# Patient Record
Sex: Male | Born: 1985 | Race: White | Hispanic: No | Marital: Single | Smoking: Current every day smoker
Health system: Southern US, Community
[De-identification: ages and names within clinical notes are randomized; demographics above are authoritative.]

## PROBLEM LIST (undated history)

## (undated) DIAGNOSIS — F909 Attention-deficit hyperactivity disorder, unspecified type: Secondary | ICD-10-CM

## (undated) HISTORY — DX: Attention-deficit hyperactivity disorder, unspecified type: F90.9

---
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
## 2006-03-05 ENCOUNTER — Emergency Department (HOSPITAL_COMMUNITY): Admission: EM | Admit: 2006-03-05 | Discharge: 2006-03-05 | Payer: Self-pay | Admitting: Family Medicine

## 2008-07-12 ENCOUNTER — Emergency Department (HOSPITAL_COMMUNITY): Admission: EM | Admit: 2008-07-12 | Discharge: 2008-07-12 | Payer: Self-pay | Admitting: Emergency Medicine

## 2009-05-21 IMAGING — CR DG ABDOMEN ACUTE W/ 1V CHEST
3 series · 3 of 3 positions shown · non-contrast
Comparison: None

CLINICAL DATA: Shortness of breath, vomiting, diarrhea

ACUTE ABDOMEN SERIES (ABDOMEN 2 VIEW & CHEST 1 VIEW)

[w chest pa]
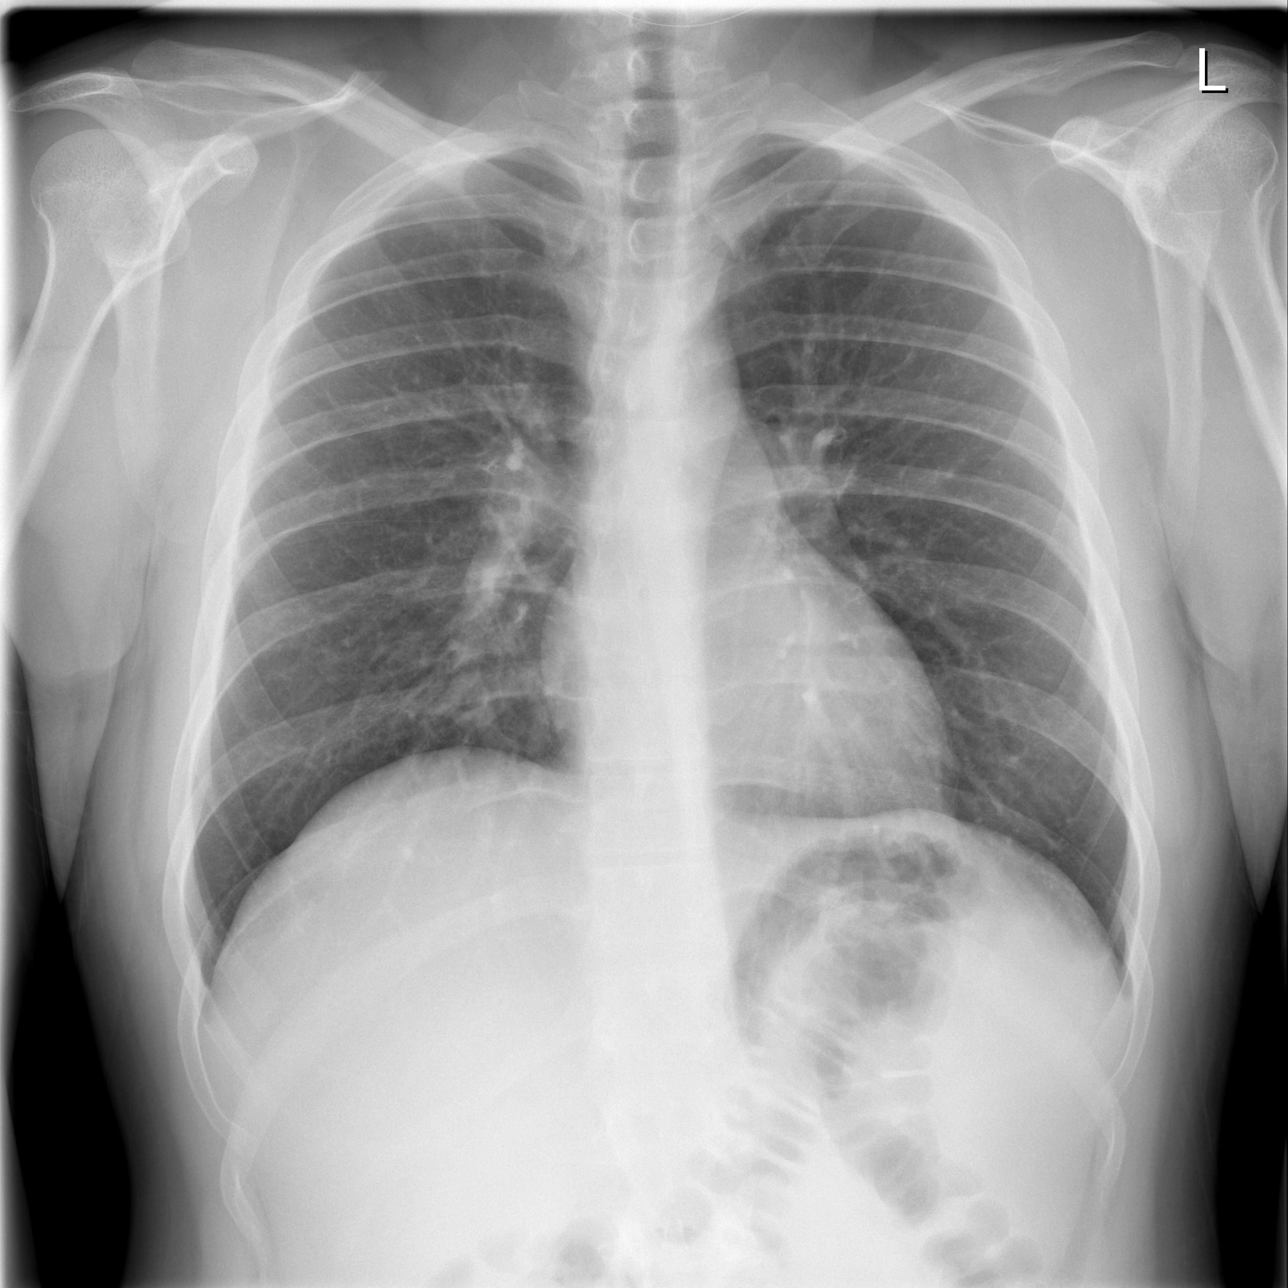

[w abdomen upright]
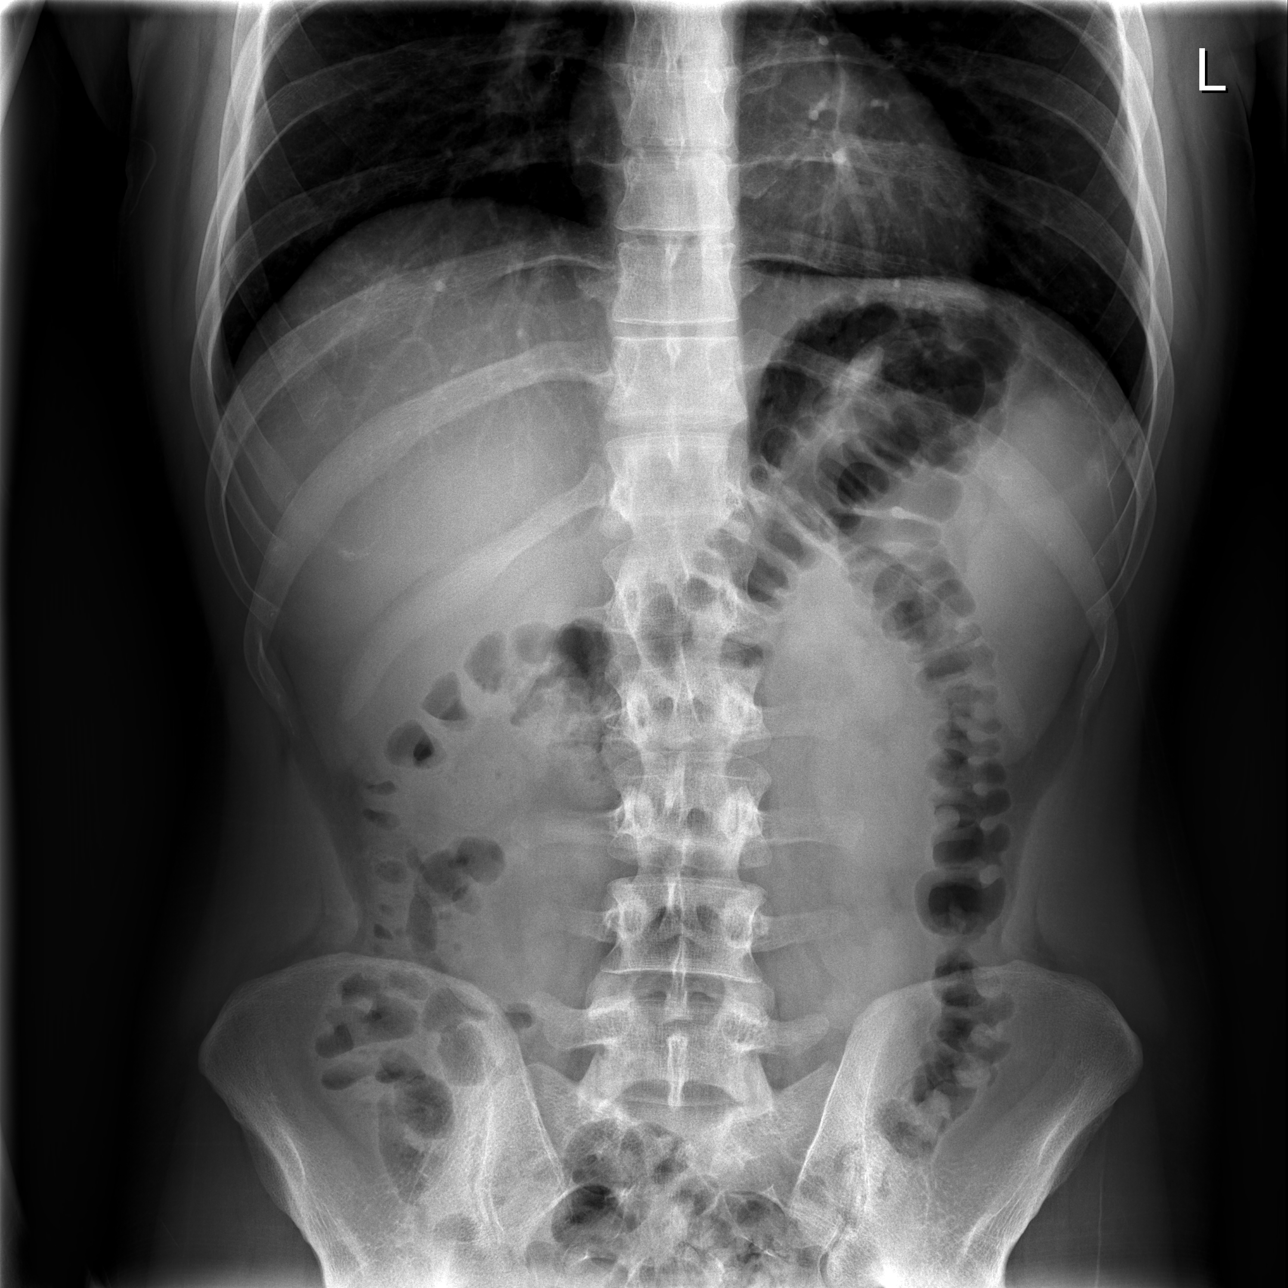

[t abdomen supine]
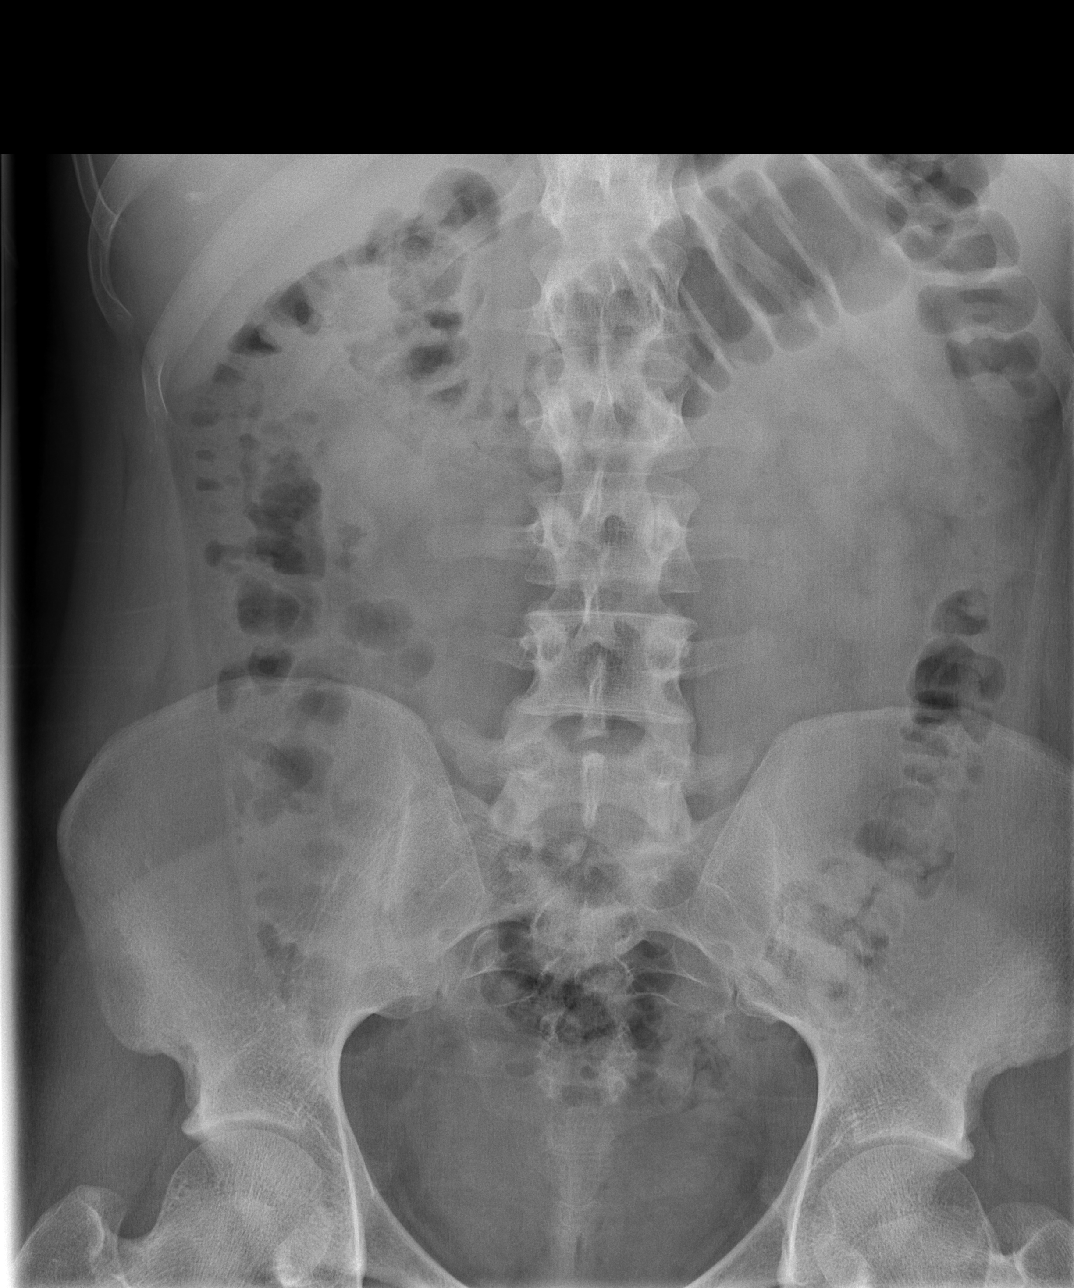

[3 of 3 positions shown; findings below may reference images not displayed]

FINDINGS: There is no evidence of dilated bowel loops or free
intraperitoneal air.  No radiopaque calculi or other significant
radiographic abnormality is seen. Heart size and mediastinal
contours are within normal limits.  Both lungs are clear.
IMPRESSION: Negative abdominal radiographs.  No acute cardiopulmonary disease.

## 2010-10-22 LAB — DIFFERENTIAL
Basophils Absolute: 0 10*3/uL (ref 0.0–0.1)
Basophils Relative: 1 % (ref 0–1)
Eosinophils Absolute: 0.2 10*3/uL (ref 0.0–0.7)
Eosinophils Relative: 2 % (ref 0–5)
Lymphocytes Relative: 18 % (ref 12–46)
Lymphs Abs: 1.5 10*3/uL (ref 0.7–4.0)
Monocytes Absolute: 0.5 10*3/uL (ref 0.1–1.0)
Monocytes Relative: 6 % (ref 3–12)
Neutro Abs: 6.5 10*3/uL (ref 1.7–7.7)
Neutrophils Relative %: 74 % (ref 43–77)

## 2010-10-22 LAB — COMPREHENSIVE METABOLIC PANEL
ALT: 25 U/L (ref 0–53)
AST: 21 U/L (ref 0–37)
Albumin: 4.3 g/dL (ref 3.5–5.2)
Alkaline Phosphatase: 82 U/L (ref 39–117)
BUN: 5 mg/dL — ABNORMAL LOW (ref 6–23)
CO2: 25 mEq/L (ref 19–32)
Calcium: 9.4 mg/dL (ref 8.4–10.5)
Chloride: 105 mEq/L (ref 96–112)
Creatinine, Ser: 0.83 mg/dL (ref 0.4–1.5)
GFR calc Af Amer: >60 mL/min (ref >60)
GFR calc non Af Amer: >60 mL/min (ref >60)
Glucose, Bld: 95 mg/dL (ref 70–99)
Potassium: 4.2 mEq/L (ref 3.5–5.1)
Sodium: 139 mEq/L (ref 135–145)
Total Bilirubin: 1.5 mg/dL — ABNORMAL HIGH (ref 0.3–1.2)
Total Protein: 6.8 g/dL (ref 6.0–8.3)

## 2010-10-22 LAB — CBC
HCT: 49.3 % (ref 39.0–52.0)
Hemoglobin: 16.9 g/dL (ref 13.0–17.0)
MCHC: 34.3 g/dL (ref 30.0–36.0)
MCV: 100 fL (ref 78.0–100.0)
Platelets: 156 10*3/uL (ref 150–400)
RBC: 4.93 MIL/uL (ref 4.22–5.81)
RDW: 11.9 % (ref 11.5–15.5)
WBC: 8.8 10*3/uL (ref 4.0–10.5)

## 2011-07-05 ENCOUNTER — Encounter: Payer: Self-pay | Admitting: *Deleted

## 2011-07-05 ENCOUNTER — Emergency Department (HOSPITAL_COMMUNITY)
Admission: EM | Admit: 2011-07-05 | Discharge: 2011-07-05 | Disposition: A | Discharge status: Home / Self Care | Payer: Self-pay | Attending: Emergency Medicine | Admitting: Emergency Medicine

## 2011-07-05 DIAGNOSIS — B86 Scabies: Secondary | ICD-10-CM | POA: Insufficient documentation

## 2011-07-05 MED ORDER — PERMETHRIN 5 % EX CREA: TOPICAL_CREAM | CUTANEOUS | Status: AC

## 2011-07-05 NOTE — ED Notes (Signed)
Pt in c/o generalized rash, states he has had it x2 months, friend in house with same rash

## 2011-07-05 NOTE — ED Provider Notes (Signed)
Medical screening examination/treatment/procedure(s) were performed by non-physician practitioner and as supervising physician I was immediately available for consultation/collaboration.  Viviene Thurston T Heily Carlucci, MD 07/05/11 2342 

## 2011-07-05 NOTE — ED Provider Notes (Signed)
History     CSN: [id number]  Arrival date & time 07/05/11  1716   First MD Initiated Contact with Patient 07/05/11 1756      Chief Complaint  Patient presents with  . Rash    (Consider location/radiation/quality/duration/timing/severity/associated sxs/prior treatment) HPI Comments: Show a progressive rash over the entire body, between the finger webs in the groin, behind the knees.  The antecubital fossa is in the axilla  Patient is a 25 y.o. male presenting with rash. The history is provided by the patient.  Rash  The current episode started more than 1 week ago. The problem has been gradually worsening. The problem is associated with an unknown factor. There has been no fever. The pain is at a severity of 1/10. The patient is experiencing no pain. Associated symptoms include itching. He has tried nothing for the symptoms.    History reviewed. No pertinent past medical history.  History reviewed. No pertinent past surgical history.  History reviewed. No pertinent family history.  History  Substance Use Topics  . Smoking status: Not on file  . Smokeless tobacco: Not on file  . Alcohol Use: Not on file      Review of Systems  Constitutional: Negative.   HENT: Negative.   Eyes: Negative.   Cardiovascular: Negative.   Genitourinary: Negative.   Musculoskeletal: Negative for joint swelling.  Skin: Positive for itching and rash.  Neurological: Negative.   Hematological: Negative.   Psychiatric/Behavioral: Negative.     Allergies  Review of patient's allergies indicates no known allergies.  Home Medications   Current Outpatient Rx  Name Route Sig Dispense Refill  . AMPHETAMINE-DEXTROAMPHETAMINE 20 MG PO TABS Oral Take 20 mg by mouth daily.      . ASPIRIN 325 MG PO TABS Oral Take 325 mg by mouth every 4 (four) hours as needed. For pain relief     . HYDROCORTISONE 0.5 % EX CREA Topical Apply 1 application topically 2 (two) times daily.      Marland Kitchen PERMETHRIN 5 % EX  CREA  Apply to affected area once leave on skin for 12 hours than bath repeat in 14 days 60 g 1    BP 138/84  Pulse 87  Temp(Src) 97.7 F (36.5 C) (Oral)  Resp 20  SpO2 100%  Physical Exam  Constitutional: He appears well-developed and well-nourished.  HENT:  Head: Normocephalic.  Eyes: Pupils are equal, round, and reactive to light.  Cardiovascular: Normal rate.   Pulmonary/Chest: Effort normal.  Abdominal: Soft.  Skin: Skin is warm and dry. Rash noted.  Psychiatric: He has a normal mood and affect.    ED Course  Procedures (including critical care time)  Labs Reviewed - No data to display No results found.   1. Scabies       MDM  Scabies        Arman Filter, NP 07/05/11 1830  Arman Filter, NP 07/05/11 [phone]

## 2016-08-13 MED FILL — SUBOXONE 8 MG-2 MG SL FILM: 8-2 | 7 days supply | Qty: 14 | Fill #0

## 2018-05-06 ENCOUNTER — Ambulatory Visit: Payer: Self-pay | Admitting: Nurse Practitioner

## 2018-05-06 ENCOUNTER — Encounter: Payer: Self-pay | Admitting: Nurse Practitioner

## 2018-05-06 VITALS — BP 138/88 | HR 90 | Temp 98.0°F | Ht 67.0 in | Wt 180.2 lb

## 2018-05-06 DIAGNOSIS — F900 Attention-deficit hyperactivity disorder, predominantly inattentive type: Secondary | ICD-10-CM

## 2018-05-06 MED ORDER — LISDEXAMFETAMINE DIMESYLATE 70 MG PO CAPS: 70.0000 mg | ORAL_CAPSULE | Freq: Every day | ORAL | 0 refills | Status: DC

## 2018-05-06 NOTE — Progress Notes (Signed)
  Subjective:     Patient ID: James Hardin , male    DOB: 1986-03-07 , 32 y.o.   MRN: [id number]   Chief Complaint  Patient presents with  . Med Check    HPI   Attention Deficit with hyperactivity - he has been taking vyvanse 70 mg daily.  He is currently between here and [place], currently not working.  He is planning to call his old job.      Past Medical History:  Diagnosis Date  . ADHD (attention deficit hyperactivity disorder)      No family history on file.   Current Outpatient Medications:  .  lisdexamfetamine (VYVANSE) 70 MG capsule, Take 1 capsule (70 mg total) by mouth daily., Disp: 30 capsule, Rfl: 0   No Known Allergies   Review of Systems  Constitutional: Negative.   Respiratory: Negative.   Cardiovascular: Negative.   Skin: Negative.   Neurological: Negative.   Psychiatric/Behavioral: The patient is hyperactive.      Today's Vitals   05/06/18 1544  BP: 138/88  Pulse: 90  Temp: 98 F (36.7 C)  TempSrc: Oral  SpO2: 97%  Weight: 180 lb 3.2 oz (81.7 kg)  Height: 5\' 7"  (1.702 m)  PainSc: 0-No pain   Body mass index is 28.22 kg/m.   Objective:  Physical Exam  Constitutional: He is oriented to person, place, and time. He appears well-developed and well-nourished.  Cardiovascular: Normal rate.  Pulmonary/Chest: Effort normal and breath sounds normal.  Musculoskeletal: Normal range of motion.  Neurological: He is alert and oriented to person, place, and time.  Skin: Skin is warm and dry.        Assessment And Plan:     1. Attention deficit hyperactivity disorder (ADHD), predominantly inattentive type  Chronic, controlled  Continue with current medications  Form completed for [facility]       Arnette Felts, FNP

## 2018-05-06 NOTE — Addendum Note (Signed)
Addended by: Arnette Felts F on: 05/06/2018 04:15 PM   Modules accepted: Orders

## 2018-05-13 ENCOUNTER — Telehealth: Payer: Self-pay

## 2018-05-13 NOTE — Telephone Encounter (Signed)
Called [employer] about them sending forms stating the patient was denied and the second letter stating they had some missing documents of the patient's proof of income. They stated the patient was denied because he could be eligible for medicaid. She also stated if patient is does not qualify for medicaid we would need to send in a letter of necessity along with the denial letter. Patient is aware. YRL,RMA

## 2019-09-27 ENCOUNTER — Other Ambulatory Visit: Payer: Self-pay

## 2019-09-27 ENCOUNTER — Telehealth: Payer: Self-pay | Admitting: Nurse Practitioner

## 2019-09-27 ENCOUNTER — Encounter: Payer: Self-pay | Admitting: Nurse Practitioner

## 2019-09-27 ENCOUNTER — Ambulatory Visit (INDEPENDENT_AMBULATORY_CARE_PROVIDER_SITE_OTHER): Payer: Self-pay | Admitting: Nurse Practitioner

## 2019-09-27 VITALS — BP 118/76 | HR 84 | Temp 97.7°F | Ht 67.0 in | Wt 170.2 lb

## 2019-09-27 DIAGNOSIS — F900 Attention-deficit hyperactivity disorder, predominantly inattentive type: Secondary | ICD-10-CM

## 2019-09-27 DIAGNOSIS — R369 Urethral discharge, unspecified: Secondary | ICD-10-CM

## 2019-09-27 DIAGNOSIS — A64 Unspecified sexually transmitted disease: Secondary | ICD-10-CM

## 2019-09-27 MED ORDER — LISDEXAMFETAMINE DIMESYLATE 70 MG PO CAPS: 70.0000 mg | ORAL_CAPSULE | Freq: Every day | ORAL | 0 refills | Status: DC

## 2019-09-27 NOTE — Progress Notes (Signed)
Based on what you shared with me it looks like you have STD,that should be evaluated in a face to face office visit. Gonorrhea needs to be treated with an IM injection. You also need to be tested for other STD. Please see someone as soon as possible.    NOTE: If you entered your credit card information for this eVisit, you will not be charged. You may see a "hold" on your card for the $35 but that hold will drop off and you will not have a charge processed.  If you are having a true medical emergency please call 911.     For an urgent face to face visit, [facility] has four urgent care centers for your convenience:   . [facility]    [phone]                  Get Driving Directions  [address] [place], [place] [address] . 10 am to 8 pm Monday-Friday . 12 pm to 8 pm Saturday-Sunday   . [facility]  [phone]                  Get Driving Directions  [address], [address] [place], [place] [address] . 8 am to 8 pm Monday-Friday . 9 am to 6 pm Saturday . 11 am to 6 pm Sunday   . [facility] at [facility]  [phone]                  Get Driving Directions   [address] [place], [place] [address] . 8 am to 8 pm Monday-Friday . 8 am to 4 pm Saturday-Sunday    . [facility] at [facility] Directions  [phone]  [address] [place], [place] [address]  . Monday-Friday, 12 PM to 6 PM    Your e-visit answers were reviewed by a board certified advanced clinical practitioner to complete your personal care plan.  Thank you for using e-Visits.

## 2019-09-27 NOTE — Progress Notes (Signed)
This visit occurred during the SARS-CoV-2 public health emergency.  Safety protocols were in place, including screening questions prior to the visit, additional usage of staff PPE, and extensive cleaning of exam room while observing appropriate contact time as indicated for disinfecting solutions.  Subjective:     Patient ID: James Hardin , male    DOB: 22-Oct-1985 , 34 y.o.   MRN: [id number]   Chief Complaint  Patient presents with  . 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                   Here today for ADHD medications has been out for quite some time and admits to taking his brothers adderall at times.                  Past Medical History:  Diagnosis Date  . ADHD (attention deficit hyperactivity disorder)      No family history on file.   Current Outpatient Medications:  .  lisdexamfetamine (VYVANSE) 70 MG capsule, Take 1 capsule (70 mg total) by mouth daily., Disp: 30 capsule, Rfl: 0   No Known Allergies   Review of Systems  Constitutional: Negative.   Respiratory: Negative.   Cardiovascular: Negative.  Negative for chest pain, palpitations and leg swelling.  Neurological: Negative for dizziness and headaches.  Psychiatric/Behavioral: Negative.      Today's Vitals   09/27/19 1638  BP: 118/76  Pulse: 84  Temp: 97.7 F (36.5 C)  TempSrc: Oral  SpO2: 97%  Weight: 170 lb 3.2 oz (77.2 kg)  Height: 5\' 7"  (1.702 m)   Body mass index is 26.66 kg/m.   Objective:  Physical Exam Constitutional:      Appearance: Normal appearance.  Cardiovascular:     Rate and Rhythm: Normal rate and regular rhythm.     Pulses: Normal pulses.     Heart sounds: Normal heart sounds. No murmur.  Pulmonary:     Effort: Pulmonary effort is  normal.     Breath sounds: Normal breath sounds.  Skin:    General: Skin is warm.     Capillary Refill: Capillary refill takes less than 2 seconds.  Neurological:     General: No focal deficit present.     Mental Status: He is alert and oriented to person, place, and time.     Cranial Nerves: No cranial nerve deficit.  Psychiatric:        Mood and Affect: Mood normal.        Behavior: Behavior normal.        Thought Content: Thought content normal.        Judgment: Judgment normal.         Assessment And Plan:     1. Attention deficit hyperactivity disorder (ADHD), predominantly inattentive type  Will restart his vyvanse  I have explained to him to not take others medications  He will drop the form off for [employer] financial assistance - lisdexamfetamine (VYVANSE) 70 MG capsule; Take 1 capsule (70 mg total) by mouth daily.  Dispense: 30 capsule; Refill: 0  2. Penile discharge  He is concerned may have an STD but has an appt at  the health department tomorrow morning.    Arnette Felts, FNP    THE PATIENT IS ENCOURAGED TO PRACTICE SOCIAL DISTANCING DUE TO THE COVID-19 PANDEMIC.

## 2019-10-04 ENCOUNTER — Encounter: Payer: Self-pay | Admitting: Nurse Practitioner

## 2019-10-21 ENCOUNTER — Other Ambulatory Visit: Payer: Self-pay | Admitting: Nurse Practitioner

## 2019-10-21 DIAGNOSIS — F900 Attention-deficit hyperactivity disorder, predominantly inattentive type: Secondary | ICD-10-CM

## 2019-10-21 MED ORDER — LISDEXAMFETAMINE DIMESYLATE 70 MG PO CAPS: 70.0000 mg | ORAL_CAPSULE | Freq: Every day | ORAL | 0 refills | Status: AC

## 2019-12-09 ENCOUNTER — Encounter (HOSPITAL_COMMUNITY): Payer: Self-pay

## 2019-12-09 ENCOUNTER — Other Ambulatory Visit: Payer: Self-pay

## 2019-12-09 ENCOUNTER — Emergency Department (HOSPITAL_COMMUNITY)
Admission: EM | Admit: 2019-12-09 | Discharge: 2019-12-10 | Discharge status: Against Medical Advice | Payer: Self-pay | Attending: Emergency Medicine | Admitting: Emergency Medicine

## 2019-12-09 DIAGNOSIS — Z5321 Procedure and treatment not carried out due to patient leaving prior to being seen by health care provider: Secondary | ICD-10-CM | POA: Insufficient documentation

## 2019-12-09 DIAGNOSIS — R05 Cough: Secondary | ICD-10-CM | POA: Insufficient documentation

## 2019-12-09 NOTE — ED Notes (Signed)
Called for pt 3x times, no answer
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
## 2019-12-28 ENCOUNTER — Ambulatory Visit: Payer: Self-pay | Admitting: Nurse Practitioner

## 2021-11-21 ENCOUNTER — Emergency Department (EMERGENCY_DEPARTMENT_HOSPITAL): Payer: MEDICAID

## 2021-11-21 ENCOUNTER — Emergency Department (HOSPITAL_BASED_OUTPATIENT_CLINIC_OR_DEPARTMENT_OTHER): Payer: Medicaid Other

## 2021-11-21 ENCOUNTER — Emergency Department
Admission: EM | Admit: 2021-11-21 | Discharge: 2021-11-21 | Disposition: A | Discharge status: Home / Self Care | Payer: MEDICAID | Attending: FAMILY PRACTICE | Admitting: FAMILY PRACTICE

## 2021-11-21 ENCOUNTER — Encounter (HOSPITAL_BASED_OUTPATIENT_CLINIC_OR_DEPARTMENT_OTHER): Payer: Self-pay | Admitting: FAMILY PRACTICE

## 2021-11-21 ENCOUNTER — Other Ambulatory Visit: Payer: Self-pay

## 2021-11-21 DIAGNOSIS — Z7252 High risk homosexual behavior: Secondary | ICD-10-CM

## 2021-11-21 DIAGNOSIS — Z20822 Contact with and (suspected) exposure to covid-19: Secondary | ICD-10-CM | POA: Insufficient documentation

## 2021-11-21 DIAGNOSIS — R6883 Chills (without fever): Secondary | ICD-10-CM

## 2021-11-21 DIAGNOSIS — R531 Weakness: Secondary | ICD-10-CM

## 2021-11-21 DIAGNOSIS — Z23 Encounter for immunization: Secondary | ICD-10-CM | POA: Insufficient documentation

## 2021-11-21 DIAGNOSIS — L02413 Cutaneous abscess of right upper limb: Secondary | ICD-10-CM | POA: Insufficient documentation

## 2021-11-21 DIAGNOSIS — R5383 Other fatigue: Secondary | ICD-10-CM

## 2021-11-21 DIAGNOSIS — Z7251 High risk heterosexual behavior: Secondary | ICD-10-CM

## 2021-11-21 DIAGNOSIS — R61 Generalized hyperhidrosis: Secondary | ICD-10-CM

## 2021-11-21 DIAGNOSIS — R6889 Other general symptoms and signs: Secondary | ICD-10-CM | POA: Insufficient documentation

## 2021-11-21 DIAGNOSIS — L02419 Cutaneous abscess of limb, unspecified: Secondary | ICD-10-CM

## 2021-11-21 LAB — COMPREHENSIVE METABOLIC PANEL, NON-FASTING
ALBUMIN/GLOBULIN RATIO: 1.2 (ref 0.8–1.4)
ALBUMIN: 4 g/dL (ref 3.4–5.0)
ALKALINE PHOSPHATASE: 94 U/L (ref 46–116)
ALT (SGPT): 13 U/L (ref <=78)
ANION GAP: 11 mmol/L (ref 10–20)
AST (SGOT): 18 U/L (ref 15–37)
BILIRUBIN TOTAL: 0.6 mg/dL (ref 0.2–1.0)
BUN/CREA RATIO: 15
BUN: 12 mg/dL (ref 7–18)
CALCIUM, CORRECTED: 9.9 mg/dL
CALCIUM: 9.9 mg/dL (ref 8.5–10.1)
CHLORIDE: 105 mmol/L (ref 98–107)
CO2 TOTAL: 24 mmol/L (ref 21–32)
CREATININE: 0.81 mg/dL (ref 0.70–1.30)
ESTIMATED GFR: 119 mL/min/{1.73_m2} (ref >59)
GLOBULIN: 3.4
GLUCOSE: 97 mg/dL (ref 74–106)
OSMOLALITY, CALCULATED: 279 mOsm/kg (ref 270–290)
POTASSIUM: 3.8 mmol/L (ref 3.5–5.1)
PROTEIN TOTAL: 7.4 g/dL (ref 6.4–8.2)
SODIUM: 140 mmol/L (ref 136–145)

## 2021-11-21 LAB — CBC WITH DIFF
BASOPHIL #: 0.01 10*3/uL (ref 0.00–0.30)
BASOPHIL %: 0 % (ref 0–3)
EOSINOPHIL #: 0.13 10*3/uL (ref 0.00–0.80)
EOSINOPHIL %: 3 % (ref 0–7)
HCT: 43.1 % (ref 42.0–51.0)
HGB: 14.9 g/dL (ref 13.5–18.0)
LYMPHOCYTE #: 1.34 10*3/uL (ref 1.10–5.00)
LYMPHOCYTE %: 30 % (ref 25–45)
MCH: 33.6 pg — ABNORMAL HIGH (ref 27.0–32.0)
MCHC: 34.6 g/dL (ref 32.0–36.0)
MCV: 96.9 fL (ref 78.0–99.0)
MONOCYTE #: 0.36 10*3/uL (ref 0.00–1.30)
MONOCYTE %: 8 % (ref 0–12)
MPV: 9.4 fL (ref 7.4–10.4)
NEUTROPHIL #: 2.59 10*3/uL (ref 1.80–8.40)
NEUTROPHIL %: 58 % (ref 40–76)
PLATELETS: 170 10*3/uL (ref 140–440)
RBC: 4.45 10*6/uL (ref 4.20–6.00)
RDW: 13.7 % (ref 11.6–14.8)
WBC: 4.4 10*3/uL (ref 4.0–10.5)

## 2021-11-21 LAB — COVID-19, FLU A/B, RSV RAPID BY PCR
INFLUENZA VIRUS TYPE A: NOT DETECTED
INFLUENZA VIRUS TYPE B: NOT DETECTED
RESPIRATORY SYNCTIAL VIRUS (RSV): NOT DETECTED
SARS-CoV-2: NOT DETECTED

## 2021-11-21 MED ORDER — SULFAMETHOXAZOLE 800 MG-TRIMETHOPRIM 160 MG TABLET
1.0000 | ORAL_TABLET | ORAL | Status: AC
Start: 2021-11-21 — End: 2021-11-21
  Administered 2021-11-21: 160 mg via ORAL

## 2021-11-21 MED ORDER — SULFAMETHOXAZOLE 800 MG-TRIMETHOPRIM 160 MG TABLET
ORAL_TABLET | ORAL | Status: AC
Start: 2021-11-21 — End: 2021-11-21
  Filled 2021-11-21: qty 1

## 2021-11-21 MED ORDER — DIPHTH,PERTUS(AC)TETANUS(PF)2 LF-(2.5-5-3-5MCG)-5 LF/0.5 ML IM SYRINGE
0.5000 mL | INJECTION | INTRAMUSCULAR | Status: AC
Start: 2021-11-21 — End: 2021-11-21
  Administered 2021-11-21: 0.5 mL via INTRAMUSCULAR

## 2021-11-21 MED ORDER — DIPHTH,PERTUS(AC)TETANUS(PF)2 LF-(2.5-5-3-5MCG)-5 LF/0.5 ML IM SYRINGE
INJECTION | INTRAMUSCULAR | Status: AC
Start: 2021-11-21 — End: 2021-11-21
  Filled 2021-11-21: qty 0.5

## 2021-11-21 MED ORDER — SULFAMETHOXAZOLE 800 MG-TRIMETHOPRIM 160 MG TABLET
1.0000 | ORAL_TABLET | Freq: Two times a day (BID) | ORAL | 0 refills | Status: AC
Start: 2021-11-21 — End: 2021-12-01

## 2021-11-21 NOTE — ED Nurses Note (Signed)
Band-Aid applied to right shoulder site.

## 2021-11-21 NOTE — ED Provider Notes (Signed)
[facility] Emergency Department  ED Primary Provider Note  History of Present Illness   Chief Complaint   Patient presents with   . Abscess     Alex Chase is a 35 y.o. male who had concerns including Abscess.  Arrival: The patient arrived by Car    This 36 year old male patient presents emergency department with complaints of flu-like symptoms, fatigue, sweats and chills at night, changes in appetite stool habits for the last few weeks.  He also has a boil on the anterior aspect of the right deltoid.  He says he gets sores quite frequently, worries that he may have diabetes.  Patient is in the same sex relationship, always using barrier protection.  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  His weight has been stable.  Last tetanus is greater than 10 years.    Patient is not COVID or influenza vaccinated.        Review of Systems   Pertinent positive and negative ROS as per HPI.  Historical Data   History Reviewed This Encounter: Medical History  Surgical History  Family History  Social History      Physical Exam   ED Triage Vitals [11/21/21 1422]   BP (Non-Invasive) (!) 153/103   Heart Rate (!) 114   Respiratory Rate 18   Temperature 36.7 C (98.1 F)   SpO2 99 %   Weight 77.1 kg (170 lb)   Height 1.727 m (5' 8")     Physical Exam   General:  No acute distress nontoxic   Ears:  TMs clear throat for traction   Nasal:  Injected mucosa, patent bilaterally   Oral:  Pink moist oropharynx:  Pink moist   Neck:  Supple   Lungs:  Clear symmetrical good aeration   Heart: Regular rate rhythm S1-S2 without murmur gallop   Abdomen:  Soft normal bowel sounds nontender   Extremities:  Moving symmetrically.    Skin:  He does have a proximally 1 cm in diameter raised red lesion with a scab centrally located.  No drainage no streaks.  He is no axillary adenopathy.  Does have several scrapes on  his hand with no signs of streaks, infection, drainage.    Neurological:  No focal deficits.    Patient Data     Labs Ordered/Reviewed   CBC WITH DIFF - Abnormal; Notable for the following components:       Result Value    MCH 33.6 (*)     All other components within normal limits   CBC/DIFF    Narrative:     The following orders were created for panel order CBC/DIFF.  Procedure                               Abnormality         Status                     ---------                               -----------         ------                     CBC WITH  [id number]]                Abnormal            Final result                 Please view results for these tests on the individual orders.   COMPREHENSIVE METABOLIC PANEL, NON-FASTING    Narrative:     Estimated Glomerular Filtration Rate (eGFR) is calculated using the CKD-EPI (2021) equation, intended for patients 35 years of age and older. If gender is not documented or "unknown", there will be no eGFR calculation.   HEPATITIS A (HAV) IGM ANTIBODY   HEPATITIS B SURFACE ANTIGEN   HEPATITIS B CORE IGM, AB   HEPATITIS C ANTIBODY SCREEN WITH REFLEX TO HCV PCR   HIV1/HIV2 SCREEN, COMBINED ANTIGEN AND ANTIBODY   COVID-19, FLU A/B, RSV RAPID BY PCR   NEISSERIA GONORRHOEAE RNA, NAAT     XR CHEST AP   Final Result by Edi, Radresults In (05/17 1546)   No focal alveolar infiltrate is identified.            Radiologist location ID: [facility] Decision Making        Medical Decision Making  High complexity due to unprotected intercourse, multiple skin lesions, flu-like symptoms.    Abscess of upper arm: acute illness or injury  Amount and/or Complexity of Data Reviewed  Labs: ordered.     Details: Labs reviewed noted in chart  Radiology: ordered.     Details: Reviewed, noted in chart      Risk  Prescription drug management.  Parenteral controlled substances.               Medications Administered in the ED   trimethoprim-sulfamethoxazole (BACTRIM DS) 160-859m  per tablet (160 mg Oral Given 11/21/21 1608)   diphtheria,pertussis-acel,tetanus (ADACEL) vaccine (0.5 mL IntraMUSCULAR Given 11/21/21 1609)     Clinical Impression   Abscess of upper arm (Primary)   Flu-like symptoms   Unprotected sexual intercourse       Disposition: Discharged    ..Marland KitchenBretta Bang DO

## 2021-11-21 NOTE — Discharge Instructions (Addendum)
Give ray small dermal subdermal abscess, most sounds diabetes on your lab, the other laboratory data is pending and will be back in the next 1-2 days.  You may use the patient care portal to review your labs, her call back to the emergency department for results.  Warm compresses 2 to 3 times a day on the abscess on the upper arm, taking Bactrim twice daily for 10 days, avoid sinuses cause a sun sensitivity rash.  You can use over-the-counter cough and cold preparations as directed for your symptoms.  Bland diet and follow-up primary care provider.

## 2021-11-21 NOTE — ED Triage Notes (Addendum)
Has sore on right shoulder onset last night.  States his friend was in ER yesterday diagnosed with staph infection.    Also wants checked for STDs and hepatitis.    States his partner has hepatitis.  Also complains of body aches and flu like symptoms.

## 2021-11-23 LAB — HEPATITIS B SURFACE ANTIGEN: HBV SURFACE ANTIGEN QUALITATIVE: NEGATIVE

## 2021-11-23 LAB — NEISSERIA GONORRHOEAE RNA, NAAT: NEISSERIA GONORRHEA GC RNA: NEGATIVE

## 2021-11-23 LAB — HEPATITIS B CORE IGM, AB: HBV CORE IGM ANTIBODY QUALITATIVE: NEGATIVE

## 2021-11-23 LAB — HIV1/HIV2 SCREEN, COMBINED ANTIGEN AND ANTIBODY: HIV SCREEN, COMBINED ANTIGEN & ANTIBODY: NEGATIVE

## 2021-11-23 LAB — HEPATITIS C ANTIBODY SCREEN WITH REFLEX TO HCV PCR: HCV ANTIBODY QUALITATIVE: NEGATIVE

## 2021-11-23 LAB — HEPATITIS A (HAV) IGM ANTIBODY: HAV IGM: NEGATIVE

## 2023-03-22 ENCOUNTER — Other Ambulatory Visit: Payer: Self-pay | Admitting: Nurse Practitioner

## 2023-03-22 DIAGNOSIS — F900 Attention-deficit hyperactivity disorder, predominantly inattentive type: Secondary | ICD-10-CM

## 2023-03-24 ENCOUNTER — Ambulatory Visit (INDEPENDENT_AMBULATORY_CARE_PROVIDER_SITE_OTHER): Payer: Medicaid - Out of State | Admitting: Physician Assistant
